# Patient Record
Sex: Female | Born: 1950 | Race: White | Hispanic: No | Smoking: Never smoker
Health system: Southern US, Community
[De-identification: ages and names within clinical notes are randomized; demographics above are authoritative.]

## PROBLEM LIST (undated history)

## (undated) DIAGNOSIS — J45909 Unspecified asthma, uncomplicated: Secondary | ICD-10-CM

## (undated) DIAGNOSIS — E119 Type 2 diabetes mellitus without complications: Secondary | ICD-10-CM

## (undated) DIAGNOSIS — F419 Anxiety disorder, unspecified: Secondary | ICD-10-CM

---

## 2005-03-06 ENCOUNTER — Ambulatory Visit: Payer: Self-pay | Admitting: Gastroenterology

## 2005-04-06 ENCOUNTER — Ambulatory Visit: Payer: Self-pay | Admitting: Gastroenterology

## 2006-10-04 ENCOUNTER — Other Ambulatory Visit: Admission: RE | Admit: 2006-10-04 | Discharge: 2006-10-04 | Payer: Self-pay | Admitting: Obstetrics & Gynecology

## 2007-10-14 ENCOUNTER — Other Ambulatory Visit: Admission: RE | Admit: 2007-10-14 | Discharge: 2007-10-14 | Payer: Self-pay | Admitting: Obstetrics and Gynecology

## 2012-06-09 ENCOUNTER — Ambulatory Visit: Payer: Self-pay | Admitting: Certified Nurse Midwife

## 2013-08-18 ENCOUNTER — Emergency Department (HOSPITAL_COMMUNITY)
Admission: EM | Admit: 2013-08-18 | Discharge: 2013-08-18 | Disposition: A | Discharge status: Home / Self Care | Payer: 59 | Attending: Emergency Medicine | Admitting: Emergency Medicine

## 2013-08-18 ENCOUNTER — Emergency Department (HOSPITAL_COMMUNITY): Payer: 59

## 2013-08-18 ENCOUNTER — Encounter (HOSPITAL_COMMUNITY): Payer: Self-pay | Admitting: Emergency Medicine

## 2013-08-18 DIAGNOSIS — J45909 Unspecified asthma, uncomplicated: Secondary | ICD-10-CM | POA: Insufficient documentation

## 2013-08-18 DIAGNOSIS — Z79899 Other long term (current) drug therapy: Secondary | ICD-10-CM | POA: Insufficient documentation

## 2013-08-18 DIAGNOSIS — K2100 Gastro-esophageal reflux disease with esophagitis, without bleeding: Secondary | ICD-10-CM

## 2013-08-18 DIAGNOSIS — F411 Generalized anxiety disorder: Secondary | ICD-10-CM | POA: Insufficient documentation

## 2013-08-18 DIAGNOSIS — R079 Chest pain, unspecified: Secondary | ICD-10-CM

## 2013-08-18 DIAGNOSIS — E119 Type 2 diabetes mellitus without complications: Secondary | ICD-10-CM | POA: Insufficient documentation

## 2013-08-18 HISTORY — DX: Anxiety disorder, unspecified: F41.9

## 2013-08-18 HISTORY — DX: Type 2 diabetes mellitus without complications: E11.9

## 2013-08-18 HISTORY — DX: Unspecified asthma, uncomplicated: J45.909

## 2013-08-18 LAB — URINALYSIS, ROUTINE W REFLEX MICROSCOPIC
BILIRUBIN URINE: NEGATIVE
Glucose, UA: NEGATIVE mg/dL
HGB URINE DIPSTICK: NEGATIVE
Ketones, ur: NEGATIVE mg/dL
Leukocytes, UA: NEGATIVE
Nitrite: NEGATIVE
PROTEIN: NEGATIVE mg/dL
Specific Gravity, Urine: 1.009 (ref 1.005–1.030)
UROBILINOGEN UA: 0.2 mg/dL (ref 0.0–1.0)
pH: 6.5 (ref 5.0–8.0)

## 2013-08-18 LAB — COMPREHENSIVE METABOLIC PANEL
ALK PHOS: 104 U/L (ref 39–117)
ALT: 15 U/L (ref 0–35)
AST: 19 U/L (ref 0–37)
Albumin: 3.8 g/dL (ref 3.5–5.2)
BUN: 12 mg/dL (ref 6–23)
CO2: 26 meq/L (ref 19–32)
Calcium: 10.2 mg/dL (ref 8.4–10.5)
Chloride: 99 mEq/L (ref 96–112)
Creatinine, Ser: 0.7 mg/dL (ref 0.50–1.10)
GFR calc Af Amer: >90 mL/min (ref >90)
GLUCOSE: 117 mg/dL — AB (ref 70–99)
POTASSIUM: 4.1 meq/L (ref 3.7–5.3)
SODIUM: 140 meq/L (ref 137–147)
Total Bilirubin: 0.4 mg/dL (ref 0.3–1.2)
Total Protein: 7.5 g/dL (ref 6.0–8.3)

## 2013-08-18 LAB — CBC WITH DIFFERENTIAL/PLATELET
Basophils Absolute: 0 10*3/uL (ref 0.0–0.1)
Basophils Relative: 0 % (ref 0–1)
EOS ABS: 0.1 10*3/uL (ref 0.0–0.7)
EOS PCT: 1 % (ref 0–5)
HCT: 42.1 % (ref 36.0–46.0)
Hemoglobin: 14 g/dL (ref 12.0–15.0)
LYMPHS ABS: 1.9 10*3/uL (ref 0.7–4.0)
Lymphocytes Relative: 12 % (ref 12–46)
MCH: 30.8 pg (ref 26.0–34.0)
MCHC: 33.3 g/dL (ref 30.0–36.0)
MCV: 92.7 fL (ref 78.0–100.0)
Monocytes Absolute: 1.5 10*3/uL — ABNORMAL HIGH (ref 0.1–1.0)
Monocytes Relative: 10 % (ref 3–12)
Neutro Abs: 11.8 10*3/uL — ABNORMAL HIGH (ref 1.7–7.7)
Neutrophils Relative %: 77 % (ref 43–77)
Platelets: 301 10*3/uL (ref 150–400)
RBC: 4.54 MIL/uL (ref 3.87–5.11)
RDW: 13.1 % (ref 11.5–15.5)
WBC: 15.3 10*3/uL — ABNORMAL HIGH (ref 4.0–10.5)

## 2013-08-18 LAB — TROPONIN I: Troponin I: <0.3 ng/mL (ref <0.30)

## 2013-08-18 MED ORDER — TRAMADOL HCL 50 MG PO TABS: 50.0000 mg | ORAL_TABLET | Freq: Four times a day (QID) | ORAL | Status: AC | PRN

## 2013-08-18 MED ORDER — OMEPRAZOLE 20 MG PO CPDR: 20.0000 mg | DELAYED_RELEASE_CAPSULE | Freq: Two times a day (BID) | ORAL | Status: AC

## 2013-08-18 MED ORDER — PANTOPRAZOLE SODIUM 40 MG PO TBEC
40.0000 mg | DELAYED_RELEASE_TABLET | Freq: Every day | ORAL | Status: DC
  Administered 2013-08-18: 40 mg via ORAL
  Filled 2013-08-18: qty 1

## 2013-08-18 NOTE — ED Provider Notes (Signed)
CSN: [id number]     Arrival date & time 08/18/13  1040 History   First MD Initiated Contact with Patient 08/18/13 1118     Chief Complaint  Patient presents with  . Chest Pain      HPI  Patient presents with chest pain. 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 She does work last night at 6:00. She leaned over to pick something up and felt sudden discomfort in her chest describes it as a burning. She is most comfortable sitting upright. She leans forward or lays back her symptoms become worse. She takes a big deep breath she does have some discomfort. She has some pain in her leg last night she took some Motrin. The Motrin made her leg and her, but her chest worse. She is not short of breath. She's not nauseated. However, she states when she she feels like things are going to go down she's not been regurgitating or vomiting. No history of known heart disease. Family history of heart disease. History of hypertension. Lifetime nonsmoker.  Past Medical History  Diagnosis Date  . Asthma   . Diabetes mellitus without complication   . Anxiety    No past surgical history on file. No family history on file. History  Substance Use Topics  . Smoking status: Never Smoker   . Smokeless tobacco: Not on file  . Alcohol Use: Yes   OB History   Grav Para Term Preterm Abortions TAB SAB Ect Mult Living                 Review of Systems  Constitutional: Negative for fever, chills, diaphoresis, appetite change and fatigue.  HENT: Negative for mouth sores, sore throat and trouble swallowing.   Eyes: Negative for visual disturbance.  Respiratory: Negative for cough, chest tightness, shortness of breath and wheezing.   Cardiovascular: Positive for chest pain.  Gastrointestinal: Negative for nausea, vomiting, abdominal pain, diarrhea and abdominal distention.       Odynophagia  Endocrine: Negative for polydipsia, polyphagia and polyuria.  Genitourinary: Negative for dysuria,  frequency and hematuria.  Musculoskeletal: Negative for gait problem.  Skin: Negative for color change, pallor and rash.  Neurological: Negative for dizziness, syncope, light-headedness and headaches.  Hematological: Does not bruise/bleed easily.  Psychiatric/Behavioral: Negative for behavioral problems and confusion.      Allergies  Review of patient's allergies indicates no known allergies.  Home Medications   Prior to Admission medications   Medication Sig Start Date End Date Taking? Authorizing Provider  Calcium-Vitamin D-Vitamin K (VIACTIV PO) Take 1 tablet by mouth daily.   Yes Historical Provider, MD  citalopram (CELEXA) 20 MG tablet Take 20 mg by mouth at bedtime.   Yes Historical Provider, MD  ergocalciferol (VITAMIN D2) 50000 UNITS capsule Take 50,000 Units by mouth once a week. On saturday   Yes Historical Provider, MD  Fluticasone-Salmeterol (ADVAIR) 100-50 MCG/DOSE AEPB Inhale 1 puff into the lungs 2 (two) times daily.   Yes Historical Provider, MD  Glucosamine-Chondroitin (GLUCOSAMINE CHONDR COMPLEX PO) Take 1 tablet by mouth daily.   Yes Historical Provider, MD  LECITHIN PO Take 1 tablet by mouth daily.   Yes Historical Provider, MD  loratadine (CLARITIN) 10 MG tablet Take 10 mg by mouth at bedtime.   Yes Historical Provider, MD  montelukast (SINGULAIR) 10 MG tablet Take 10 mg by mouth at bedtime.   Yes Historical Provider, MD  Multiple Vitamin (MULTIVITAMIN WITH MINERALS) TABS tablet Take 1 tablet by mouth daily.  Yes Historical Provider, MD  ramipril (ALTACE) 5 MG capsule Take 5 mg by mouth at bedtime.   Yes Historical Provider, MD  Red Yeast Rice Extract (RED YEAST RICE PO) Take 1 tablet by mouth daily.   Yes Historical Provider, MD  vitamin E (VITAMIN E) 400 UNIT capsule Take 400 Units by mouth daily.   Yes Historical Provider, MD  omeprazole (PRILOSEC) 20 MG capsule Take 1 capsule (20 mg total) by mouth 2 (two) times daily. 08/18/13   Rolland PorterMark James, MD  traMADol  (ULTRAM) 50 MG tablet Take 1 tablet (50 mg total) by mouth every 6 (six) hours as needed. 08/18/13   Rolland PorterMark James, MD   BP 173/61  Pulse 68  Temp(Src) 99.1 F (37.3 C)  Resp 16  Wt 231 lb (104.781 kg)  SpO2 96% Physical Exam  Constitutional: She is oriented to person, place, and time. She appears well-developed and well-nourished. No distress.  HENT:  Head: Normocephalic.  Eyes: Conjunctivae are normal. Pupils are equal, round, and reactive to light. No scleral icterus.  Neck: Normal range of motion. Neck supple. No thyromegaly present.  Cardiovascular: Normal rate and regular rhythm.  Exam reveals no gallop and no friction rub.   No murmur heard. Pulmonary/Chest: Effort normal and breath sounds normal. Bradypneic: clear bilateral breath sounds. No respiratory distress. She has no wheezes. She has no rales.  Bilateral breath sounds.  Abdominal: Soft. Bowel sounds are normal. She exhibits no distension. There is no tenderness. There is no rebound.  Musculoskeletal: Normal range of motion.  Neurological: She is alert and oriented to person, place, and time.  Skin: Skin is warm and dry. No rash noted.  Psychiatric: She has a normal mood and affect. Her behavior is normal.    ED Course  Procedures (including critical care time) Labs Review Labs Reviewed  COMPREHENSIVE METABOLIC PANEL - Abnormal; Notable for the following:    Glucose, Bld 117 (*)    All other components within normal limits  CBC WITH DIFFERENTIAL - Abnormal; Notable for the following:    WBC 15.3 (*)    Neutro Abs 11.8 (*)    Monocytes Absolute 1.5 (*)    All other components within normal limits  TROPONIN I  URINALYSIS, ROUTINE W REFLEX MICROSCOPIC    Imaging Review Dg Chest 2 View  08/18/2013   CLINICAL DATA:  Chest pain.  EXAM: CHEST  2 VIEW  COMPARISON:  None.  FINDINGS: Mediastinum and hilar structures are normal. Borderline cardiomegaly. No significant pulmonary venous congestion. Bibasilar atelectasis  and/or infiltrates are present. No pleural effusion or pneumothorax. No acute osseous abnormality.  IMPRESSION: 1.  Mild bibasilar atelectasis and/or infiltrates.  2.  Borderline cardiomegaly .   Electronically Signed   By: Maisie Fushomas  Register   On: 08/18/2013 11:24     EKG Interpretation None      MDM   Final diagnoses:  Chest pain, unspecified chest pain type  Gastroesophageal reflux disease with esophagitis    The more than 18 hours. Normal troponin. Normal EKG. Symptoms are completely consistent with reflux. Given Protonix. Asked to avoid alcohol caffeine anti-inflammatories. Primary care followup.    Rolland PorterMark James, MD 08/18/13 1251

## 2013-08-18 NOTE — ED Notes (Signed)
midsternal cp that started last night  Fells better when she sits up hurts worse when she lays down  Hurts to take a deep breath no n/a rt arm tingled last night had left leg pain last night

## 2013-08-18 NOTE — Discharge Instructions (Signed)
No alcohol, NSAIDs, or caffeine.   Chest Pain (Nonspecific) It is often hard to give a specific diagnosis for the cause of chest pain. There is always a chance that your pain could be related to something serious, such as a heart attack or a blood clot in the lungs. You need to follow up with your health care provider for further evaluation. CAUSES   Heartburn.  Pneumonia or bronchitis.  Anxiety or stress.  Inflammation around your heart (pericarditis) or lung (pleuritis or pleurisy).  A blood clot in the lung.  A collapsed lung (pneumothorax). It can develop suddenly on its own (spontaneous pneumothorax) or from trauma to the chest.  Shingles infection (herpes zoster virus). The chest wall is composed of bones, muscles, and cartilage. Any of these can be the source of the pain.  The bones can be bruised by injury.  The muscles or cartilage can be strained by coughing or overwork.  The cartilage can be affected by inflammation and become sore (costochondritis). DIAGNOSIS  Lab tests or other studies may be needed to find the cause of your pain. Your health care provider may have you take a test called an ambulatory electrocardiogram (ECG). An ECG records your heartbeat patterns over a 24-hour period. You may also have other tests, such as:  Transthoracic echocardiogram (TTE). During echocardiography, sound waves are used to evaluate how blood flows through your heart.  Transesophageal echocardiogram (TEE).  Cardiac monitoring. This allows your health care provider to monitor your heart rate and rhythm in real time.  Holter monitor. This is a portable device that records your heartbeat and can help diagnose heart arrhythmias. It allows your health care provider to track your heart activity for several days, if needed.  Stress tests by exercise or by giving medicine that makes the heart beat faster. TREATMENT   Treatment depends on what may be causing your chest pain. Treatment  may include:  Acid blockers for heartburn.  Anti-inflammatory medicine.  Pain medicine for inflammatory conditions.  Antibiotics if an infection is present.  You may be advised to change lifestyle habits. This includes stopping smoking and avoiding alcohol, caffeine, and chocolate.  You may be advised to keep your head raised (elevated) when sleeping. This reduces the chance of acid going backward from your stomach into your esophagus. Most of the time, nonspecific chest pain will improve within 2-3 days with rest and mild pain medicine.  HOME CARE INSTRUCTIONS   If antibiotics were prescribed, take them as directed. Finish them even if you start to feel better.  For the next few days, avoid physical activities that bring on chest pain. Continue physical activities as directed.  Do not use any tobacco products, including cigarettes, chewing tobacco, or electronic cigarettes.  Avoid drinking alcohol.  Only take medicine as directed by your health care provider.  Follow your health care provider's suggestions for further testing if your chest pain does not go away.  Keep any follow-up appointments you made. If you do not go to an appointment, you could develop lasting (chronic) problems with pain. If there is any problem keeping an appointment, call to reschedule. SEEK MEDICAL CARE IF:   Your chest pain does not go away, even after treatment.  You have a rash with blisters on your chest.  You have a fever. SEEK IMMEDIATE MEDICAL CARE IF:   You have increased chest pain or pain that spreads to your arm, neck, jaw, back, or abdomen.  You have shortness of breath.  You  have an increasing cough, or you cough up blood.  You have severe back or abdominal pain.  You feel nauseous or vomit.  You have severe weakness.  You faint.  You have chills. This is an emergency. Do not wait to see if the pain will go away. Get medical help at once. Call your local emergency services  (911 in [place] Do not drive yourself to the hospital. MAKE SURE YOU:   Understand these instructions.  Will watch your condition.  Will get help right away if you are not doing well or get worse. Document Released: 11/26/2004 Document Revised: 02/21/2013 Document Reviewed: 09/22/2007 [facility] Patient Information 2015 [place], [place]. This information is not intended to replace advice given to you by your health care provider. Make sure you discuss any questions you have with your health care provider.  Gastroesophageal Reflux Disease, Adult Gastroesophageal reflux disease (GERD) happens when acid from your stomach flows up into the esophagus. When acid comes in contact with the esophagus, the acid causes soreness (inflammation) in the esophagus. Over time, GERD may create small holes (ulcers) in the lining of the esophagus. CAUSES   Increased body weight. This puts pressure on the stomach, making acid rise from the stomach into the esophagus.  Smoking. This increases acid production in the stomach.  Drinking alcohol. This causes decreased pressure in the lower esophageal sphincter (valve or ring of muscle between the esophagus and stomach), allowing acid from the stomach into the esophagus.  Late evening meals and a full stomach. This increases pressure and acid production in the stomach.  A malformed lower esophageal sphincter. Sometimes, no cause is found. SYMPTOMS   Burning pain in the lower part of the mid-chest behind the breastbone and in the mid-stomach area. This may occur twice a week or more often.  Trouble swallowing.  Sore throat.  Dry cough.  Asthma-like symptoms including chest tightness, shortness of breath, or wheezing. DIAGNOSIS  Your caregiver may be able to diagnose GERD based on your symptoms. In some cases, X-rays and other tests may be done to check for complications or to check the condition of your stomach and esophagus. TREATMENT  Your caregiver may  recommend over-the-counter or prescription medicines to help decrease acid production. Ask your caregiver before starting or adding any new medicines.  HOME CARE INSTRUCTIONS   Change the factors that you can control. Ask your caregiver for guidance concerning weight loss, quitting smoking, and alcohol consumption.  Avoid foods and drinks that make your symptoms worse, such as:  Caffeine or alcoholic drinks.  Chocolate.  Peppermint or mint flavorings.  Garlic and onions.  Spicy foods.  Citrus fruits, such as oranges, lemons, or limes.  Tomato-based foods such as sauce, chili, salsa, and pizza.  Fried and fatty foods.  Avoid lying down for the 3 hours prior to your bedtime or prior to taking a nap.  Eat small, frequent meals instead of large meals.  Wear loose-fitting clothing. Do not wear anything tight around your waist that causes pressure on your stomach.  Raise the head of your bed 6 to 8 inches with wood blocks to help you sleep. Extra pillows will not help.  Only take over-the-counter or prescription medicines for pain, discomfort, or fever as directed by your caregiver.  Do not take aspirin, ibuprofen, or other nonsteroidal anti-inflammatory drugs (NSAIDs). SEEK IMMEDIATE MEDICAL CARE IF:   You have pain in your arms, neck, jaw, teeth, or back.  Your pain increases or changes in intensity or duration.  You develop nausea, vomiting, or sweating (diaphoresis).  You develop shortness of breath, or you faint.  Your vomit is green, yellow, black, or looks like coffee grounds or blood.  Your stool is red, bloody, or black. These symptoms could be signs of other problems, such as heart disease, gastric bleeding, or esophageal bleeding. MAKE SURE YOU:   Understand these instructions.  Will watch your condition.  Will get help right away if you are not doing well or get worse. Document Released: 11/26/2004 Document Revised: 05/11/2011 Document Reviewed:  09/05/2010 [facility] Patient Information 2015 [place], [place]. This information is not intended to replace advice given to you by your health care provider. Make sure you discuss any questions you have with your health care provider.

## 2014-11-12 IMAGING — CR DG CHEST 2V
2 series · 2 of 2 positions shown · non-contrast
Comparison: None.

CLINICAL DATA: Chest pain.

EXAM:
CHEST  2 VIEW

[w chest pa]
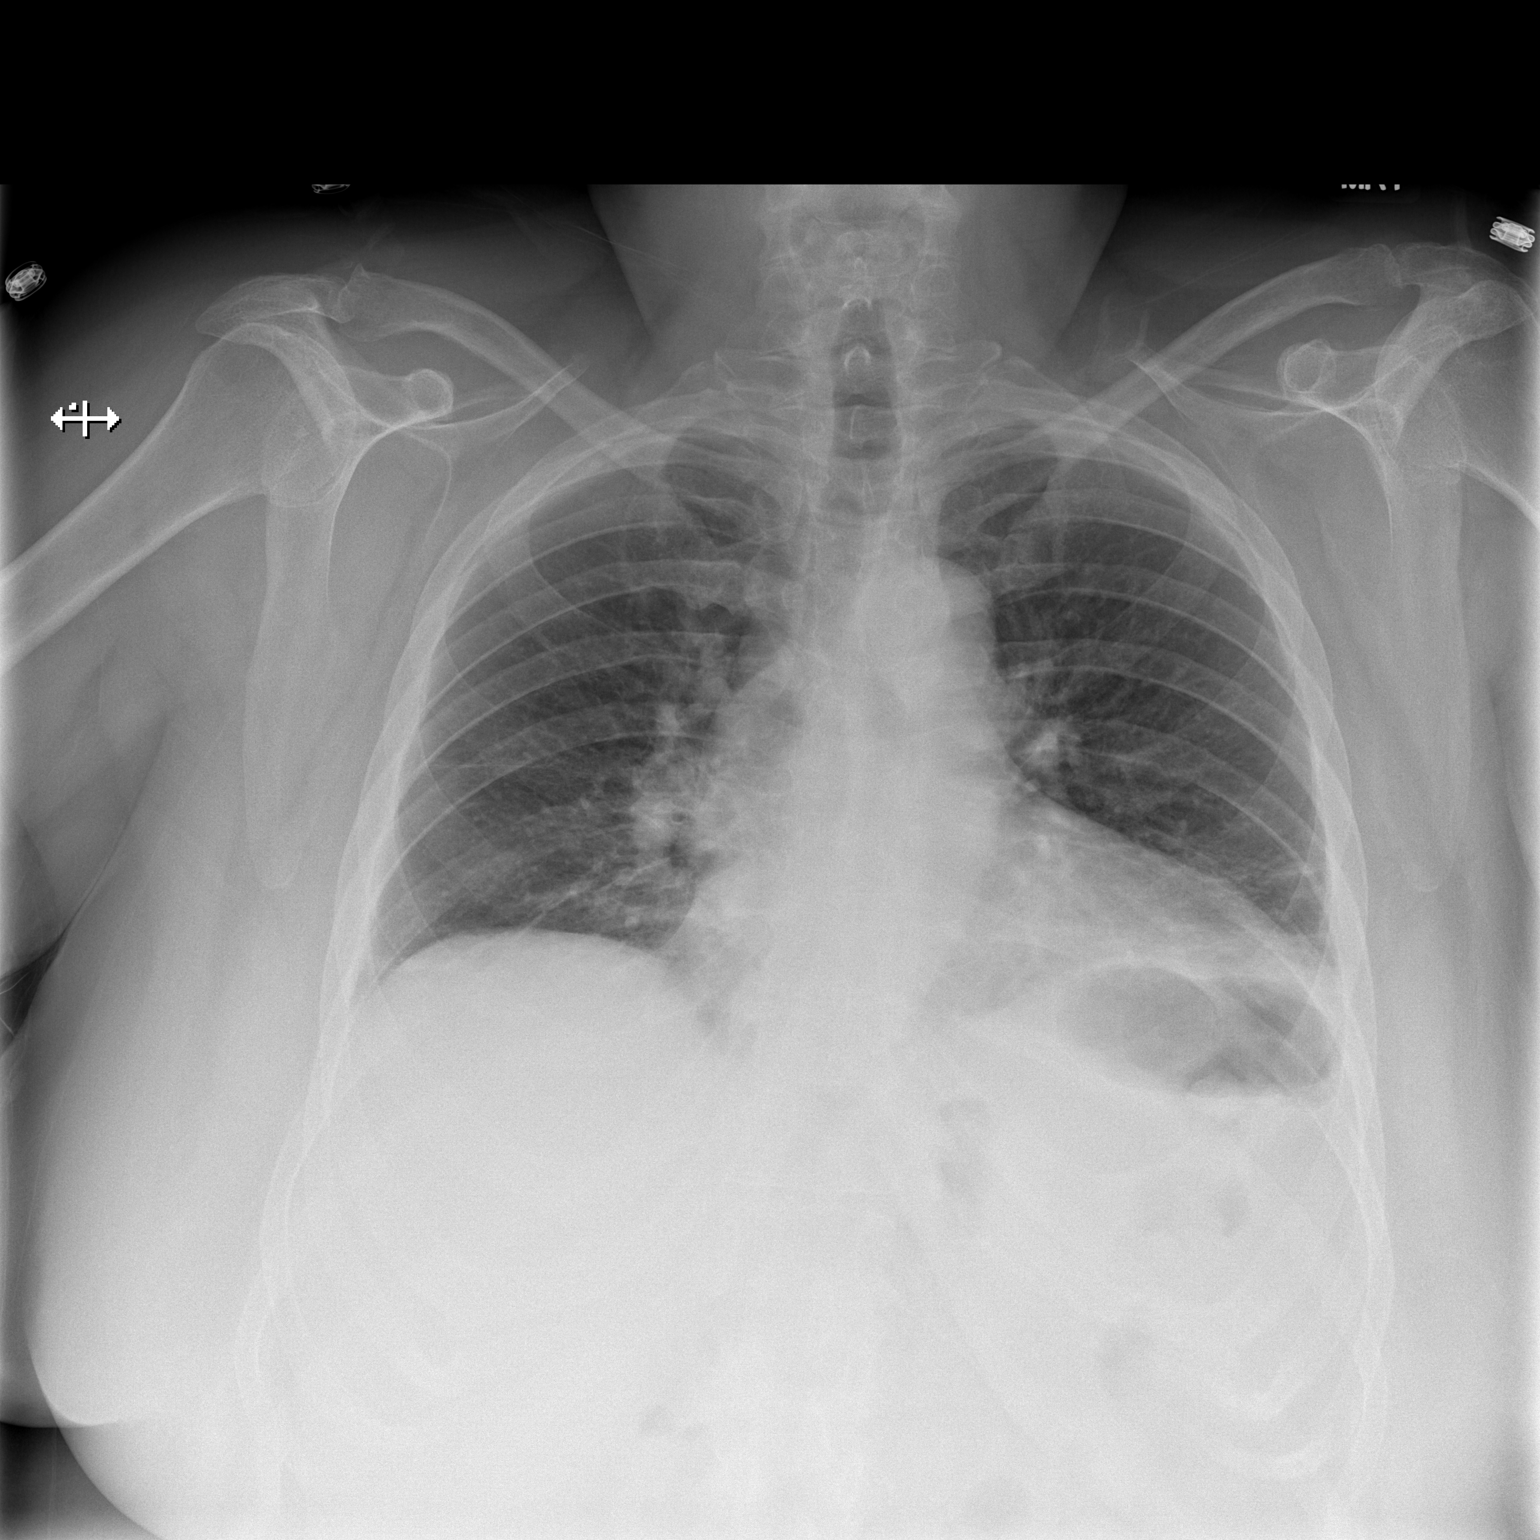

[w chest lat]
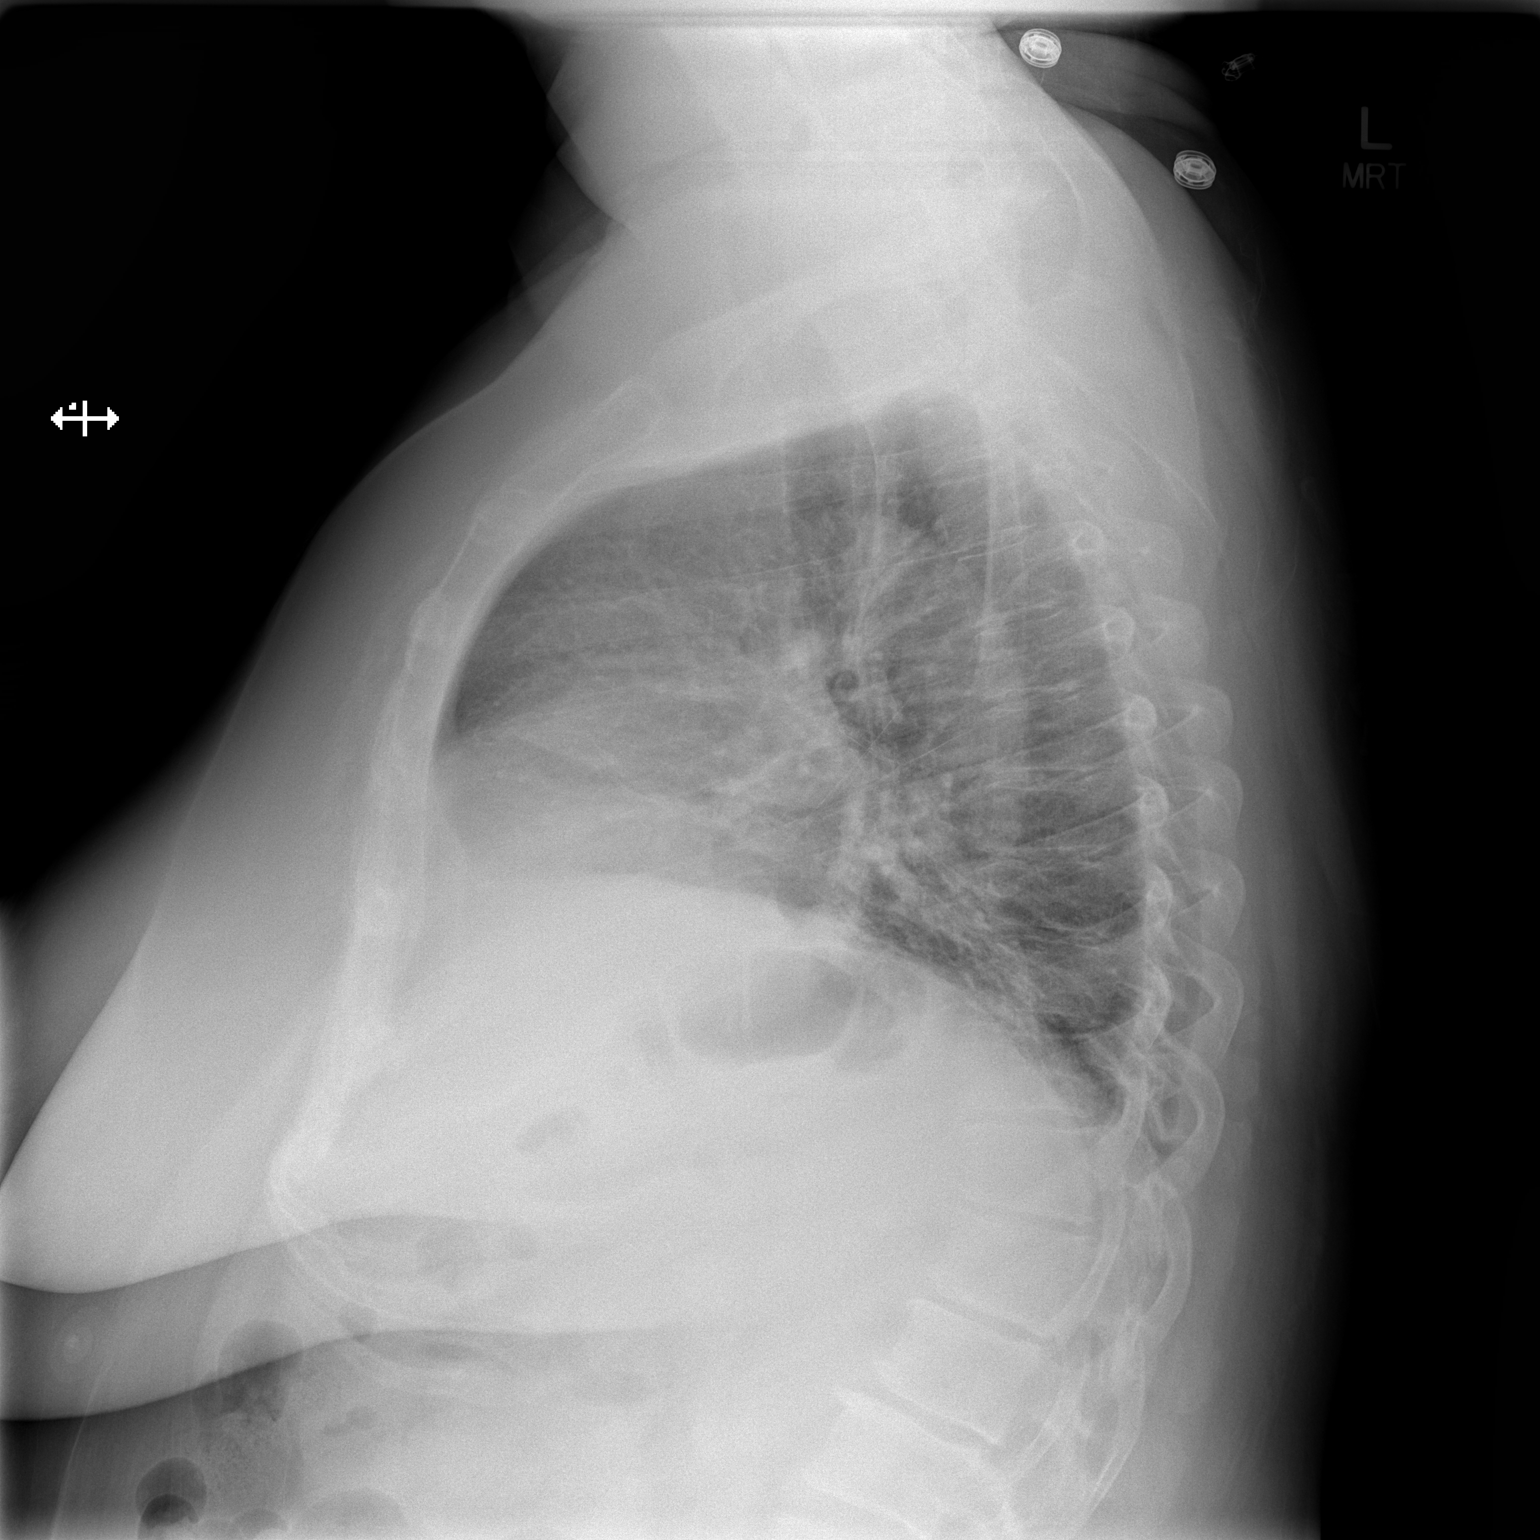

[2 of 2 positions shown; findings below may reference images not displayed]

FINDINGS: Mediastinum and hilar structures are normal. Borderline
cardiomegaly. No significant pulmonary venous congestion. Bibasilar
atelectasis and/or infiltrates are present. No pleural effusion or
pneumothorax. No acute osseous abnormality.
IMPRESSION: 1.  Mild bibasilar atelectasis and/or infiltrates.

2.  Borderline cardiomegaly .

## 2015-04-18 ENCOUNTER — Encounter: Payer: Self-pay | Admitting: Gastroenterology

## 2015-05-01 ENCOUNTER — Encounter: Payer: Self-pay | Admitting: Internal Medicine

## 2016-10-21 ENCOUNTER — Ambulatory Visit (INDEPENDENT_AMBULATORY_CARE_PROVIDER_SITE_OTHER): Payer: 59

## 2016-10-21 ENCOUNTER — Ambulatory Visit (INDEPENDENT_AMBULATORY_CARE_PROVIDER_SITE_OTHER): Payer: 59 | Admitting: Physician Assistant

## 2016-10-21 DIAGNOSIS — M25572 Pain in left ankle and joints of left foot: Secondary | ICD-10-CM | POA: Diagnosis not present

## 2016-10-21 DIAGNOSIS — S8265XA Nondisplaced fracture of lateral malleolus of left fibula, initial encounter for closed fracture: Secondary | ICD-10-CM | POA: Diagnosis not present
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
## 2016-11-11 ENCOUNTER — Ambulatory Visit (INDEPENDENT_AMBULATORY_CARE_PROVIDER_SITE_OTHER): Payer: 59 | Admitting: Orthopaedic Surgery

## 2016-11-11 ENCOUNTER — Ambulatory Visit (INDEPENDENT_AMBULATORY_CARE_PROVIDER_SITE_OTHER): Payer: 59

## 2016-11-11 DIAGNOSIS — S8265XD Nondisplaced fracture of lateral malleolus of left fibula, subsequent encounter for closed fracture with routine healing: Secondary | ICD-10-CM

## 2016-11-11 NOTE — Progress Notes (Signed)
The patient is now almost 4 weeks into a nondisplaced left ankle lateral malleolus fracture. She's been ambulating weightbearing as tolerated in a cam walking boot and said she's made interval improvement in terms of decreased swelling and decreased pain. She is even been in a pool. On examination she still has swelling of the ankle but is well located. She has good range of motion of the ankle. The swelling is decreased. There is some pain over the lateral malleolus but it's less intense as it was on the last exam.  X-rays reviewed of her left ankle show well located ankle mortise and comparing these x-rays to the last x-rays there is interval healing of her fracture as well. The fracture line is barely visible.  At this point we'll transition her to an ASO as she's comfortable and as comfort allows. I talked about how to go back and forth with the ASO in the cam walker depending on how she feels. We'll see her back for final visit with 4 weeks with just a mortise and lateral of her left ankle for only 2 views of left ankle.

## 2016-12-08 ENCOUNTER — Ambulatory Visit (INDEPENDENT_AMBULATORY_CARE_PROVIDER_SITE_OTHER): Payer: 59 | Admitting: Physician Assistant

## 2016-12-08 ENCOUNTER — Ambulatory Visit (INDEPENDENT_AMBULATORY_CARE_PROVIDER_SITE_OTHER): Payer: 59

## 2016-12-08 DIAGNOSIS — S8265XD Nondisplaced fracture of lateral malleolus of left fibula, subsequent encounter for closed fracture with routine healing: Secondary | ICD-10-CM

## 2016-12-08 NOTE — Progress Notes (Signed)
   Office Visit Note   Patient: Kaitlyn May           Date of Birth: 02/23/1951           MRN: [id number] Visit Date: 12/08/2016              Requested by: Kaitlyn Salvage, PA-C [address] [place], [place] [address] PCP: Kaitlyn Salvage, PA-C   Assessment & Plan: Visit Diagnoses:  1. Closed nondisplaced fracture of lateral malleolus of left fibula with routine healing     Plan: She will discontinue the cam walker.Encouraged her to wear an ASO brace when out of the home. She'll follow up on an as-needed basis or if she develops any mechanical symptoms which are reviewed with her today  Follow-Up Instructions: Return if symptoms worsen or fail to improve.   Orders:  Orders Placed This Encounter  Procedures  . XR Ankle 2 Views Left   No orders of the defined types were placed in this encounter.     Procedures: No procedures performed   Clinical Data: No additional findings.   Subjective: Left ankle fracture  HPI Kaitlyn May returns today follow-up of her left lateral malleolus fracture nondisplaced. Overall doing well. She was about her home without the boot but outside of the home she does still wear the boot due to the fact that she could not fit ASO brace of any of her shoes. She states she is still tender to the touch over the lateral ankle. No mechanical symptoms.  Review of Systems Negative outside of history of present illness  Objective: Vital Signs: There were no vitals taken for this visit.  Physical Exam  Constitutional: She is oriented to person, place, and time. She appears well-developed and well-nourished. No distress.  Neurological: She is alert and oriented to person, place, and time.  Skin: She is not diaphoretic.    Ortho Exam Good range of motion of the left ankle with dorsiflexion plantar flexion inversion eversion without pain. She has slight tenderness at the distal lateral malleolus only. Calf supple nontender. She has what appears to be  contact rash proximal tib-fib. Specialty Comments:  No specialty comments available.  Imaging: Xr Ankle 2 Views Left  Result Date: 12/08/2016 Left ankle 3 views: Talus well located within the ankle mortise no diastases. Lateral malleolus fracture remains nondisplaced with the significant consolidation. No other fractures identified.    PMFS History: There are no active problems to display for this patient.  Past Medical History:  Diagnosis Date  . Anxiety   . Asthma   . Diabetes mellitus without complication     No family history on file.  No past surgical history on file. Social History   Occupational History  . Not on file.   Social History Main Topics  . Smoking status: Never Smoker  . Smokeless tobacco: Not on file  . Alcohol use Yes  . Drug use: Unknown  . Sexual activity: Not on file

## 2016-12-09 ENCOUNTER — Ambulatory Visit (INDEPENDENT_AMBULATORY_CARE_PROVIDER_SITE_OTHER): Payer: 59 | Admitting: Orthopaedic Surgery

## 2022-07-10 ENCOUNTER — Other Ambulatory Visit (HOSPITAL_COMMUNITY): Payer: Self-pay | Admitting: Family Medicine

## 2022-07-10 DIAGNOSIS — R079 Chest pain, unspecified: Secondary | ICD-10-CM

## 2022-07-24 ENCOUNTER — Telehealth (HOSPITAL_COMMUNITY): Payer: Self-pay | Admitting: *Deleted

## 2022-07-24 NOTE — Telephone Encounter (Signed)
Attempted to call patient regarding upcoming cardiac CT appointment. °Left message on voicemail with name and callback number ° °Hardin Hardenbrook RN Navigator Cardiac Imaging °[facility] °[phone] Office °[phone] Cell ° °

## 2022-07-24 NOTE — Telephone Encounter (Signed)
Reaching out to patient to offer assistance regarding upcoming cardiac imaging study; pt verbalizes understanding of appt date/time, parking situation and where to check in, pre-test NPO status, and verified current allergies; name and call back number provided for further questions should they arise  Kaitlyn Venezia RN Navigator Cardiac Imaging [facility] [phone] office [phone] cell  Patient aware to arrive at 8am. 

## 2022-07-28 ENCOUNTER — Ambulatory Visit (HOSPITAL_COMMUNITY)
Admission: RE | Admit: 2022-07-28 | Discharge: 2022-07-28 | Disposition: A | Discharge status: Home / Self Care | Payer: Medicare Other | Source: Ambulatory Visit | Attending: Family Medicine | Admitting: Family Medicine

## 2022-07-28 DIAGNOSIS — I2510 Atherosclerotic heart disease of native coronary artery without angina pectoris: Secondary | ICD-10-CM | POA: Diagnosis not present

## 2022-07-28 DIAGNOSIS — R072 Precordial pain: Secondary | ICD-10-CM | POA: Diagnosis not present

## 2022-07-28 DIAGNOSIS — R079 Chest pain, unspecified: Secondary | ICD-10-CM | POA: Insufficient documentation

## 2022-07-28 MED ORDER — NITROGLYCERIN 0.4 MG SL SUBL
0.8000 mg | SUBLINGUAL_TABLET | Freq: Once | SUBLINGUAL | Status: AC
  Administered 2022-07-28: 0.8 mg via SUBLINGUAL

## 2022-07-28 MED ORDER — IOHEXOL 350 MG/ML SOLN
95.0000 mL | Freq: Once | INTRAVENOUS | Status: AC | PRN
  Administered 2022-07-28: 95 mL via INTRAVENOUS

## 2022-07-28 MED ORDER — NITROGLYCERIN 0.4 MG SL SUBL
SUBLINGUAL_TABLET | SUBLINGUAL | Status: AC
  Filled 2022-07-28: qty 2

## 2022-07-30 LAB — POCT I-STAT CREATININE: Creatinine, Ser: 0.6 mg/dL (ref 0.44–1.00)
